# Patient Record
Sex: Female | Born: 1950 | Race: Black or African American | Hispanic: No | Marital: Married
Health system: Southern US, Community
[De-identification: ages and names within clinical notes are randomized; demographics above are authoritative.]

---

## 1999-11-28 ENCOUNTER — Other Ambulatory Visit: Admission: RE | Admit: 1999-11-28 | Discharge: 1999-11-28 | Payer: Self-pay | Admitting: Family Medicine

## 2000-05-28 ENCOUNTER — Other Ambulatory Visit: Admission: RE | Admit: 2000-05-28 | Discharge: 2000-05-28 | Payer: Self-pay | Admitting: Dermatology

## 2000-08-23 ENCOUNTER — Ambulatory Visit (HOSPITAL_COMMUNITY): Admission: RE | Admit: 2000-08-23 | Discharge: 2000-08-23 | Payer: Self-pay | Admitting: Family Medicine

## 2000-08-23 ENCOUNTER — Encounter: Payer: Self-pay | Admitting: Family Medicine

## 2000-10-18 ENCOUNTER — Ambulatory Visit (HOSPITAL_COMMUNITY): Admission: RE | Admit: 2000-10-18 | Discharge: 2000-10-18 | Payer: Self-pay | Admitting: Family Medicine

## 2000-10-18 ENCOUNTER — Encounter: Payer: Self-pay | Admitting: Family Medicine

## 2000-10-28 ENCOUNTER — Other Ambulatory Visit: Admission: RE | Admit: 2000-10-28 | Discharge: 2000-10-28 | Payer: Self-pay | Admitting: Obstetrics and Gynecology

## 2000-10-28 ENCOUNTER — Encounter (INDEPENDENT_AMBULATORY_CARE_PROVIDER_SITE_OTHER): Payer: Self-pay | Admitting: Specialist

## 2000-11-22 ENCOUNTER — Encounter: Payer: Self-pay | Admitting: Obstetrics and Gynecology

## 2000-11-24 ENCOUNTER — Encounter (INDEPENDENT_AMBULATORY_CARE_PROVIDER_SITE_OTHER): Payer: Self-pay | Admitting: Specialist

## 2000-11-24 ENCOUNTER — Encounter: Payer: Self-pay | Admitting: Obstetrics and Gynecology

## 2000-11-25 ENCOUNTER — Inpatient Hospital Stay (HOSPITAL_COMMUNITY): Admission: EM | Admit: 2000-11-25 | Discharge: 2000-11-26 | Payer: Self-pay | Admitting: Obstetrics and Gynecology

## 2002-02-08 ENCOUNTER — Other Ambulatory Visit: Admission: RE | Admit: 2002-02-08 | Discharge: 2002-02-08 | Payer: Self-pay | Admitting: Obstetrics and Gynecology

## 2003-04-25 ENCOUNTER — Other Ambulatory Visit: Admission: RE | Admit: 2003-04-25 | Discharge: 2003-04-25 | Payer: Self-pay | Admitting: Obstetrics and Gynecology

## 2006-06-03 ENCOUNTER — Encounter: Admission: RE | Admit: 2006-06-03 | Discharge: 2006-06-03 | Payer: Self-pay | Admitting: Obstetrics and Gynecology

## 2007-09-08 ENCOUNTER — Encounter: Admission: RE | Admit: 2007-09-08 | Discharge: 2007-09-08 | Payer: Self-pay | Admitting: Obstetrics and Gynecology

## 2008-10-05 ENCOUNTER — Encounter: Admission: RE | Admit: 2008-10-05 | Discharge: 2008-10-05 | Payer: Self-pay | Admitting: Obstetrics and Gynecology

## 2009-10-14 ENCOUNTER — Encounter: Admission: RE | Admit: 2009-10-14 | Discharge: 2009-10-14 | Payer: Self-pay | Admitting: Obstetrics and Gynecology

## 2010-09-19 IMAGING — MG MM SCREEN MAMMOGRAM BILATERAL
5 series · 5 of 5 positions shown · non-contrast
Comparison: none

DG SCREEN MAMMOGRAM BILATERAL
Bilateral CC and MLO view(s) were taken.

DIGITAL SCREENING MAMMOGRAM WITH CAD:
There are scattered fibroglandular densities.  No masses or malignant type calcifications are 
identified.  Compared with prior studies.
Images were processed with CAD.

[R CC]
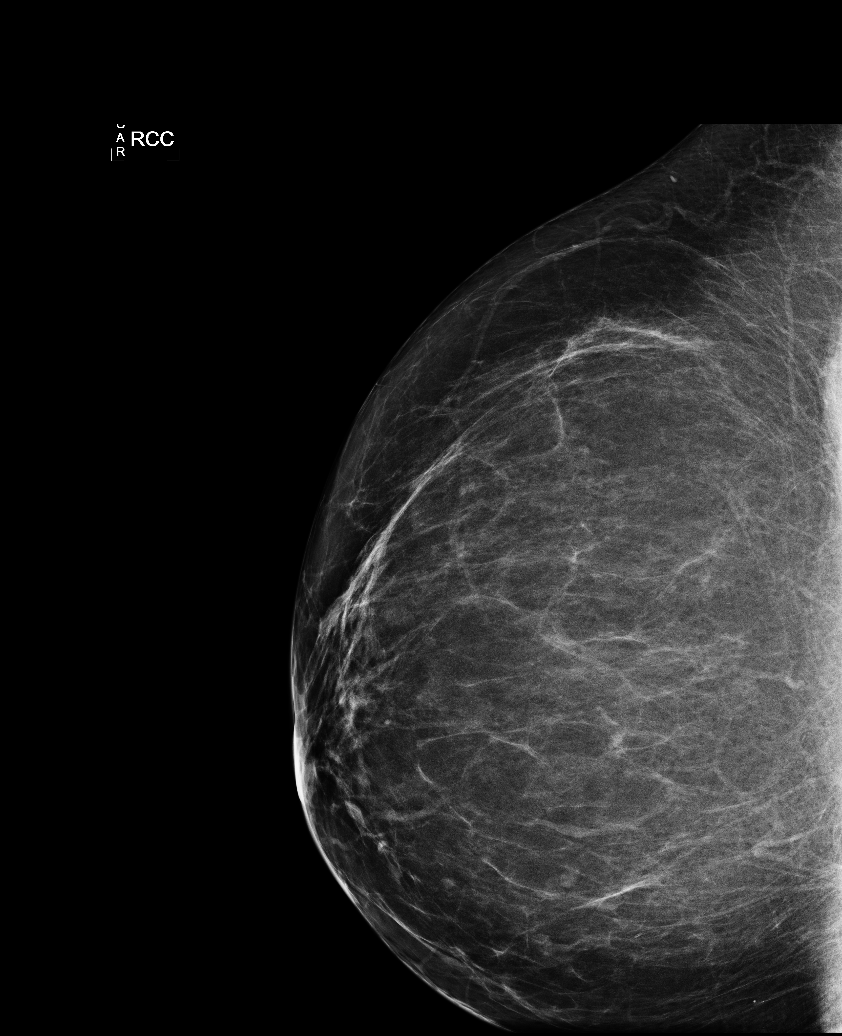

[L CC]
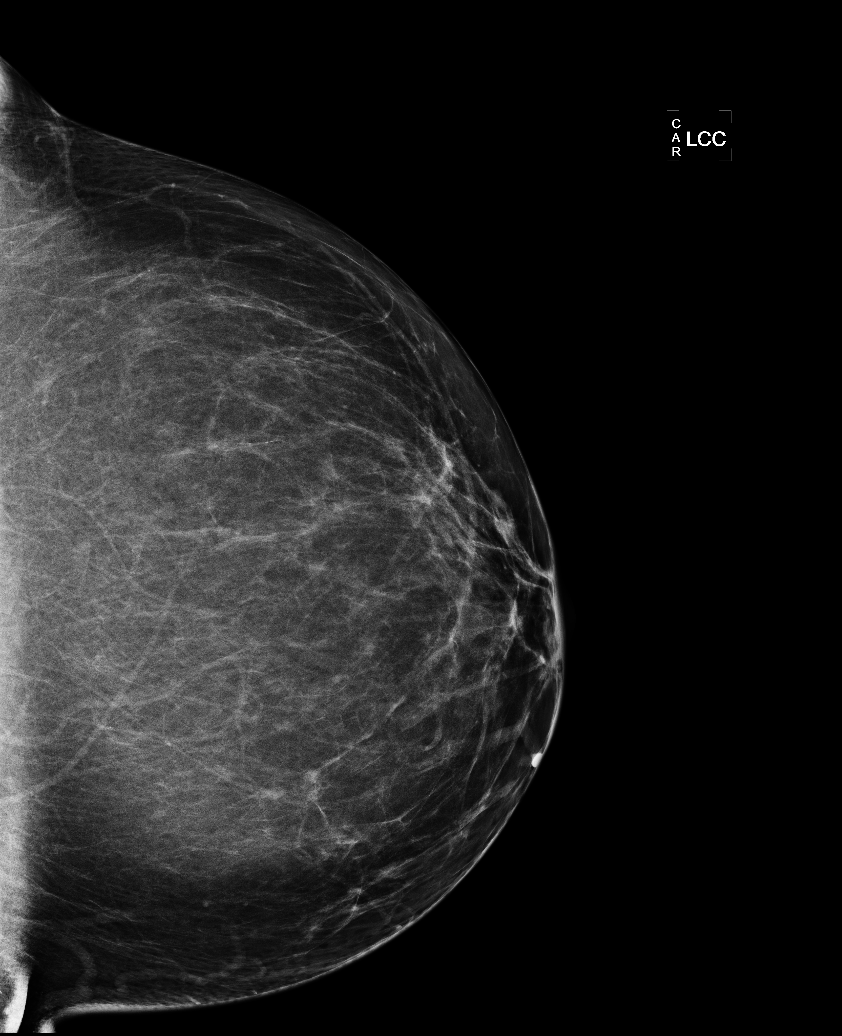

[L MLO]
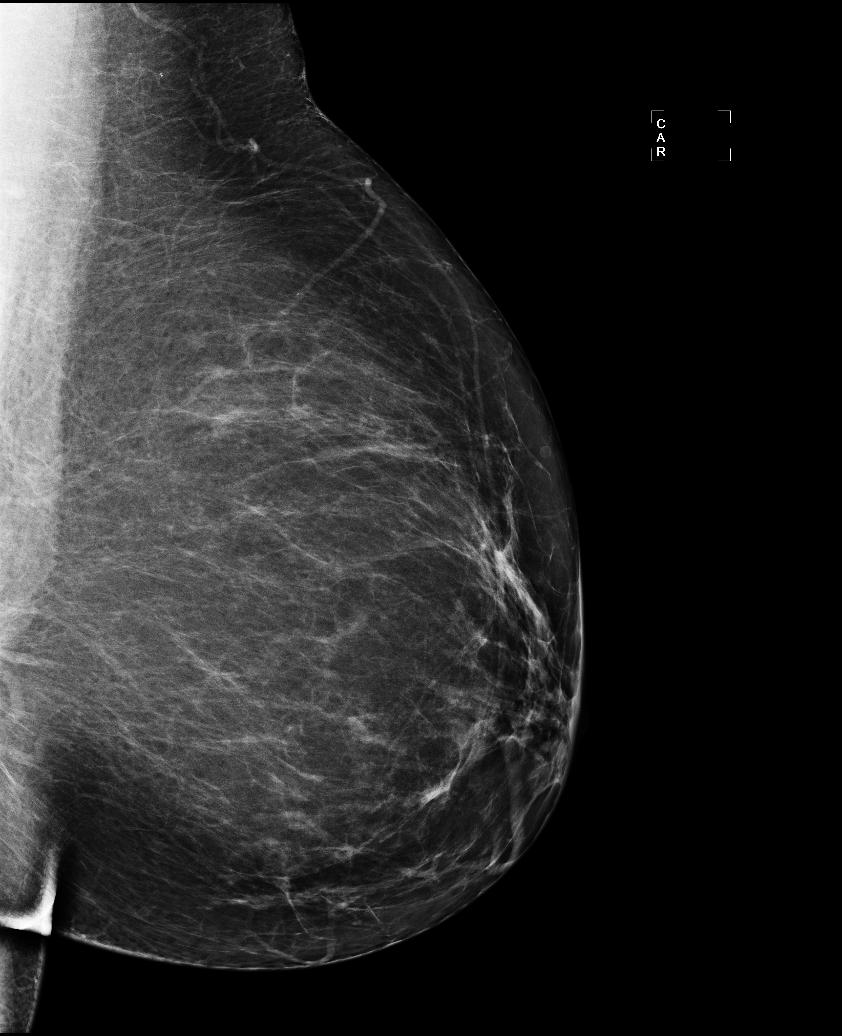

[R MLO]
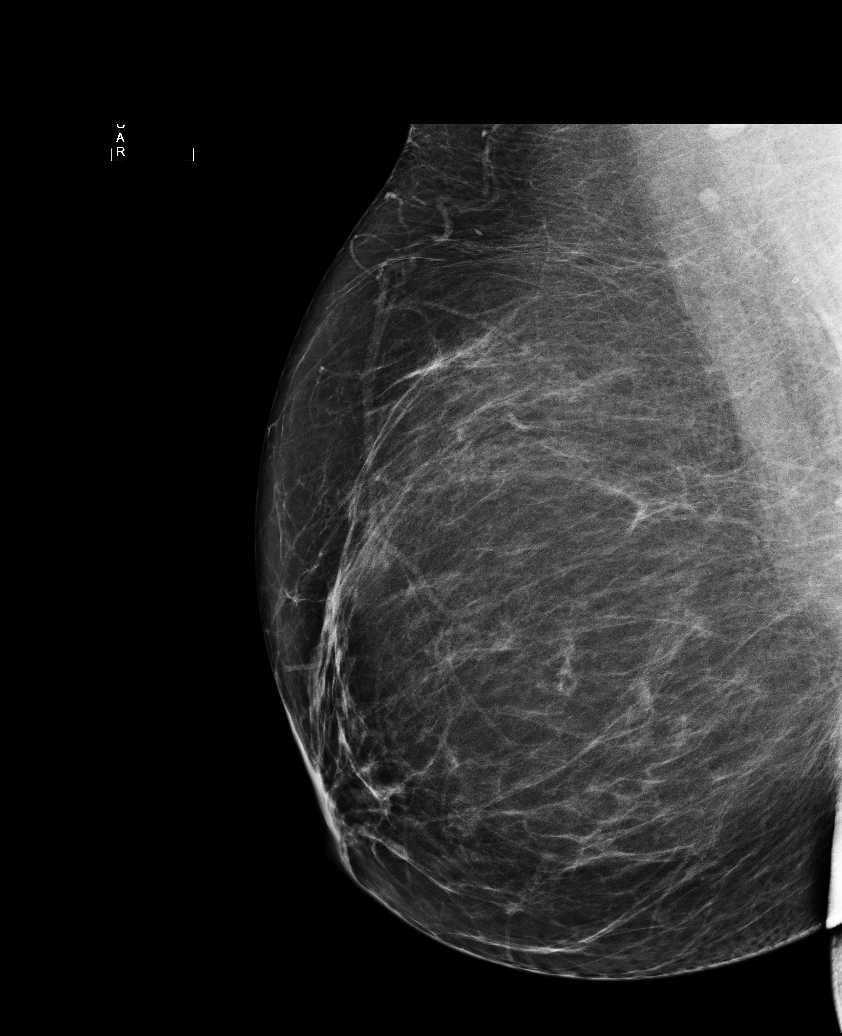

[L XCCL]
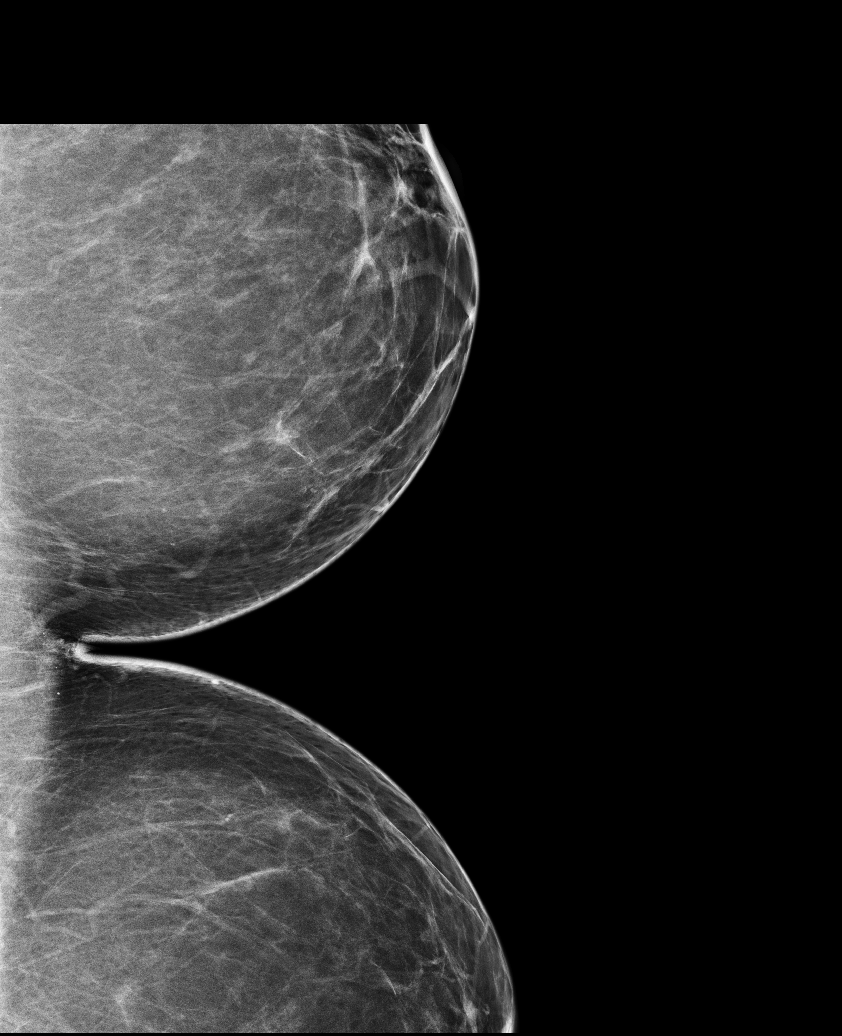

[5 of 5 positions shown; findings below may reference images not displayed]

IMPRESSION: No specific mammographic evidence of malignancy.  Next screening mammogram is recommended in one 
year.

A result letter of this screening mammogram will be mailed directly to the patient.

ASSESSMENT: Negative - BI-RADS 1

Screening mammogram in 1 year.
,

## 2010-12-01 ENCOUNTER — Encounter
Admission: RE | Admit: 2010-12-01 | Discharge: 2010-12-01 | Payer: Self-pay | Source: Home / Self Care | Attending: Obstetrics and Gynecology | Admitting: Obstetrics and Gynecology

## 2011-01-03 ENCOUNTER — Encounter: Payer: Self-pay | Admitting: Obstetrics and Gynecology

## 2011-01-04 ENCOUNTER — Encounter: Payer: Self-pay | Admitting: Obstetrics and Gynecology

## 2011-05-01 NOTE — Discharge Summary (Signed)
[facility]  Patient:    Chloe Reed, Chloe Reed               MRN: [id number] Adm. Date:  [id number] Disc. Date: [id number] Attending:  Oliver Pila                           Discharge Summary  DISCHARGE DIAGNOSES 1. Complex left adnexal mass. 2. Pelvic pain. 3. Chronic pelvic inflammatory disease. 4. Fibroid uterus. 5. Status post diagnostic laparoscopy. 6. Status post total abdominal hysterectomy, bilateral salpingo-oophorectomy    and lysis of adhesions.  DISCHARGE MEDICATIONS 1. Motrin 600 mg p.o. every six hours p.r.n. pain. 2. Percocet one to two tablets p.o. every four hours p.r.n. 3. Hydrochlorothiazide. 4. Clonidine. 5. Atenolol. 6. Flonase p.r.n.  DISCHARGE FOLLOWUP:  Patient is to follow up in the office in approximately three days for staple removal.  HOSPITAL COURSE:  Patient is a 60 year old G1, P0 who was admitted for scheduled diagnostic laparoscopy, given a complex adnexal mass which was identified, secondary to irregular menstrual bleeding and pain the patient had been experiencing.  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  The amount of adhesions and scar tissue made visualization of the ovary in question impossible; therefore, the decision was made to proceed with a total abdominal hysterectomy and bilateral salpingo-oophorectomy, for which the patient had been counseled prior to the procedure.  At the time of surgery, GYN oncologist, Dr. Rande Brunt. Clarke-Pearson, was called into the room and also visualized the liver, diaphragm and omentum and found no evidence of any ovarian cancer.  The patient was admitted after surgery for routine postoperative care and did well.  On postoperative day #2, she was tolerating a regular diet, passing good flatus,  blood pressure was normal, she was afebrile, she had good urine output.  Postoperative hemoglobin was 10. She did have some hypokalemia which was corrected in her IV fluids prior to discharge with a potassium of 3.4.  Overall, the patient did very well postoperatively and was discharged to home on postoperative day #2, to follow up as previously stated. DD:  12/09/00 TD:  12/10/00 Job: [id number]

## 2011-05-01 NOTE — Op Note (Signed)
[facility]  Patient:    Chloe Reed, Chloe Reed               MRN: [id number] Proc. Date: 11/24/00 Adm. Date:  [id number] Attending:  Oliver Pila                           Operative Report  PREOPERATIVE DIAGNOSES: 1. Complex left adnexal mass. 2. Pelvic pain.  POSTOPERATIVE DIAGNOSES: 1. Chronic pelvic inflammatory disease. 2. Fibroid uterus. 3. Bilateral tubo-ovarian abscesses.  PROCEDURES: 1. Diagnostic laparoscopy. 2. Total abdominal hysterectomy, bilateral salpingo-oophorectomy    and lysis of adhesions.  SURGEON:  Alvino Chapel, M.D.  ASSISTANT:  Malachi Pro. Ambrose Mantle, M.D.  ANESTHESIA:  General.  FLUIDS, ESTIMATED BLOOD LOSS:  900 cc.  Urine output 50 cc.  IV fluids 4800 cc with 1 unit of Hespan given.  FINDINGS:  There was chronic pelvic inflammatory disease with bilateral old tubo-ovarian abscesses and extensive scarring of both the right and the left adnexa into the cul-de-sac and into the pelvic sidewall.  There was also scarring of the rectum and bowel to the posterior surface of the uterus and the adnexa.  Liver, diaphragm, and omentum were all examined and found to be normal.  Intraoperative consultation at laparoscopy was confirmed with Dr. Stanford Breed, GYN oncologist, who confirmed that there indeed did not look to be any cancer present.  DESCRIPTION OF PROCEDURE:  The patient was taken to the operating room where general anesthesia was obtained without difficulty.  She was then prepped and draped in the normal sterile fashion in the dorsal lithotomy position.  A speculum was then placed in the vagina.  The cervix was identified, grasped with a single-tooth tenaculum and sounded with the internalized sequentially dilated.  The ZUMI syringe was then placed within the uterus for uterine manipulation, and the speculum and tenaculum were removed from the vagina. Attention was then turned to the patients abdomen  where an umbilical incision was made, and the Veress needle was introduced into the abdomen. Intraperitoneal placement was confirmed by both aspiration and injection with normal saline.  The gas was then applied, and a pneumoperitoneum was obtained with approximately 2.5 to 3 liters of CO2 gas.  The Veress needle was then removed, and the trocar was introduced into the abdomen, and the camera was then introduced into the pelvis with the findings as previously stated. Intraoperative consultation again was performed with Dr. Stanford Breed who visualized the pelvis anatomy and felt that this was not carcinogenic in nature but old PID.  The decision was also made to proceed with open abdominal hysterectomy, bilateral salpingo-oophorectomy as removal and confirmation of the left ovarian mass was impossible through the laparoscopic approach.  The abdomen was therefore then opened in a transverse fashion with a scalpel. This was carried down to the underlying layer of fascia by sharp dissection and Bovie cautery.  The fascia was then nicked in the midline and then the incision was extended laterally with Mayo scissors.  The inferior aspect of the incision was grasped with straight Kocher clamps, elevated and dissected off the underlying rectus muscles.  In a similar fashion, the superior aspect was dissected off the underlying rectus muscles.  The rectus muscles were then separated in the midline, and the peritoneum was identified, grasped with a hemostat x 2 and entered sharply with Metzenbaum scissors.  This incision was then extended both superiorly and inferiorly with careful attention to avoid both bowel and  bladder.  The pediatric bowel retractor was then placed within the abdomen, and the bowel was packed away with moist laparotomy sponges.  The adnexa and uterus were then palpated, and two long curved Kelly clamps were placed on each cornua of the uterus.  It was impossible to bluntly  elevate the adnexa from the cul-de-sac as they were densely adherent; therefore, the round ligament was separated on the patients left, then right, with an attempt made to open the retroperitoneal space.  This was successful, and the infundibular pelvic ligament on the patients left was isolated, doubly clamped with slightly curved Zeppelin clamps and transected and suture ligated with #0 Vicryl.  In a similar fashion, the retroperitoneal space was partially opened, and the infundibular pelvic ligament isolated, doubly clamped with Zeppelin clamps and transected with Mayo scissors.  Again, these pedicles were secured with #0 Vicryl sutures.  The bladder flap was then developed interiorly with dense scarring noted on the anterior surface of the uterus as well; however, there was a plane eventually established which was able to be pushed away from the lower uterine segment and cervix.  The uterus was then removed by sequentially clamping the utero-ovarian ligaments bilaterally and cutting with each step secured with a #0 Vicryl.  This was continued down to the level of the cervix.  Again, there was some difficulty in dissecting away the bladder flap and the posterior cervix had adherent tissue of the adnexa and the rectum.  This was bluntly dissected satisfactorily, and the remainder of the cervix was then clamped with straight Zeppelin clamps, transsected, and suture ligated bilaterally through the cardinal ligament to the level of the external cervical os.  The vagina was then entered, and the cervix amputated with Mayo scissors.  The uterus was then handed off to pathology.  The vaginal cuff was then closed at each angle with several figure-of-eight #0 Vicryl sutures for good hemostasis.  The pelvis and abdomen were then irrigated and suction was applied.  At this point, the left adnexa was able to be bluntly elevated out of the left lower quadrant and cul-de-sac by lysing the adhesions  digitally. With this removed, it was handed off to pathology for frozen section.  The last attention was turned to the patients right adnexa which was  significantly more adherent than the left.  A large simple-appearing cyst on the right adnexa was punctured and drained to allow increased mobility.  There was a small portion of infundibular pelvic ligament which was identified at this time untransected, and this was doubly clamped with Zeppelin, transected, and suture ligated.  The remainder of the adnexa was carefully removed by sharp and blunt dissection through dense adhesions into the cul-de-sac.  With these thus lysed, the adnexa was free and was removed from the pelvis and handed off with the uterine specimen.  Once again, the pelvis was irrigated. There were several areas of bleeding noted, one in the peritoneal surface posterior cervix which was cauterized with Bovie cautery and also suture ligated.  There were several areas of oozing along the retroperitoneal surfaces bilaterally; however, no distinct bleeders were identified.  The ureters were identified and found to be intact bilaterally, and the rectum was examined and found to be intact.  Surgicel was placed within the cul-de-sac for improved hemostasis of the slight peritoneal oozing, and all instruments and sponges were removed from the abdomen.  The 12-mm trocar site at the umbilicus was repaired under direct visualization at the fascia before the incision  was closed.  The muscle and sub fascial plane were then inspected and found to be hemostatic; therefore, the fascia was closed with #0 Vicryl in a running fashion, and the skin was closed with staples.  Sponge, lap, and needle counts were correct for the laparotomy x 2.  The laparoscopic portion of the procedure had one missing sponge; therefore, the patient underwent a stat x-ray which revealed no foreign objects in the abdomen. DD:  11/24/00 TD:  11/24/00 Job:  [id number]

## 2011-05-01 NOTE — H&P (Signed)
[facility]  Patient:    Chloe Reed, Chloe Reed                    MRN: [id number] Adm. Date:  11/24/00 Attending:  Alvino Chapel, M.D.                         History and Physical  HISTORY OF PRESENT ILLNESS:  The patient is a 60 year old G 1, P 0, 0, 1, 0, who was seen as a referral for a complex adnexal mass on October 28, 2000.  At that appointment, the patient stated that she had had irregular menses approximately  every two weeks to 21 days, with some intermenstrual bleeding, and had occasional hot flashes.  As a result of these problems, she was being seen by her family medicine doctor who ordered an ultrasound, which demonstrated both a fibroid uterus, as well as a left adnexal mass on the left, which was containing thickened septations with possible ovarian cancer noted.  PAST MEDICAL HISTORY:  Otherwise the patient is in good health.  She has chronic hypertension which was controlled with multiple medications.  She had a history of abnormal Pap smears; however, her two recent Pap smears had been normal.  PAST SURGICAL HISTORY:  Only a rectal fistula repair at 73-24 years old.  CURRENT MEDICATIONS: 1. Hydrochlorothiazide. 2. Clonidine. 3. Atenolol. 4. Flonase. 5. Claritin.  SOCIAL HISTORY:  She is a one-pack-per-day-smoker.  FAMILY HISTORY:  Significant for her mother with both renal cancer and a cancer of the reproductive organs, of unsure etiology.  There is no breast cancer in the family.  The patient has had one elective abortion in the past, and otherwise had no children.  PHYSICAL EXAMINATION:  GENERAL:  The patient appeared of normal height, and slightly overweight.  HEENT/NECK:  Within normal limits.  Thyroid within normal limits.  HEART:  Regular rate and rhythm.  LUNGS:  Clear to auscultation bilaterally.  ABDOMEN:  Soft, nontender.  EXTREMITIES:  Without edema.  The patient had normal external genitalia  with some sebaceous cysts noted.  There was no adenopathy noted in the groin area.  PELVIC:  Cervix had some nabothian cysts, otherwise no lesions.  Uterus was in he upper limits of normal size.  The right adnexa was upper limit of normal.  The eft adnexa did not palpate enlarged.  A long discussion with the patient was had, discussing the complex adnexal mass  noted on ultrasound.  The patient was counseled that this could possibly be a benign process; however, given the septations, ovarian cancer could not be excluded.  Also given the patients history of irregular menstrual bleeding and intermenstrual bleeding, she had an endometrial biopsy which was negative. Further workup included an [facility] and a CA-125 level.  The CA-125 level was normal at 14, nd the [facility] demonstrated that she was still premenopausal.  In three sessions, the patient was extensively counseled regarding the need to proceed with surgical removal of the complex left adnexal mass.  The decision was made to proceed with a laparoscopic removal of the ovary with frozen pathology obtained, and should there be any cancer noted, proceeding with a total abdominal hysterectomy, bilateral salpingo-oophorectomy, and staging procedure at the same time.  Dr. Rande Brunt. Clarke-Pearson was notified, and will be on standby for the procedure.  There were no other physical examination signs which were suspicious for an ovarian malignancy; however, the patient was counseled that this  was still a possibility, and is in favor of proceeding with full surgery, should that be the case.  The risks and benefits of both the laparoscopic and possible open procedure were discussed with the patient, including possible bleeding, infection, and damage o the bowel and bladder.  The patient understands these possibilities, and desires to proceed with surgery. DD:  11/23/00 TD:  11/23/00 Job: [id number]

## 2011-11-11 ENCOUNTER — Other Ambulatory Visit: Payer: Self-pay | Admitting: Obstetrics and Gynecology

## 2011-11-11 DIAGNOSIS — Z1231 Encounter for screening mammogram for malignant neoplasm of breast: Secondary | ICD-10-CM

## 2011-12-23 ENCOUNTER — Ambulatory Visit
Admission: RE | Admit: 2011-12-23 | Discharge: 2011-12-23 | Disposition: A | Discharge status: Home / Self Care | Payer: 59 | Source: Ambulatory Visit | Attending: Obstetrics and Gynecology | Admitting: Obstetrics and Gynecology

## 2011-12-23 ENCOUNTER — Ambulatory Visit: Payer: Self-pay

## 2011-12-23 DIAGNOSIS — Z1231 Encounter for screening mammogram for malignant neoplasm of breast: Secondary | ICD-10-CM

## 2012-11-28 IMAGING — MG MM DIGITAL SCREENING BILAT
2 series · 2 of 2 positions shown · non-contrast
Comparison: none

DG SCREEN MAMMOGRAM BILATERAL
Bilateral CC and MLO view(s) were taken.

DIGITAL SCREENING MAMMOGRAM WITH CAD:
There are scattered fibroglandular densities.  No masses or malignant type calcifications are 
identified.  Compared with prior studies.
Images were processed with CAD.

[R CC]
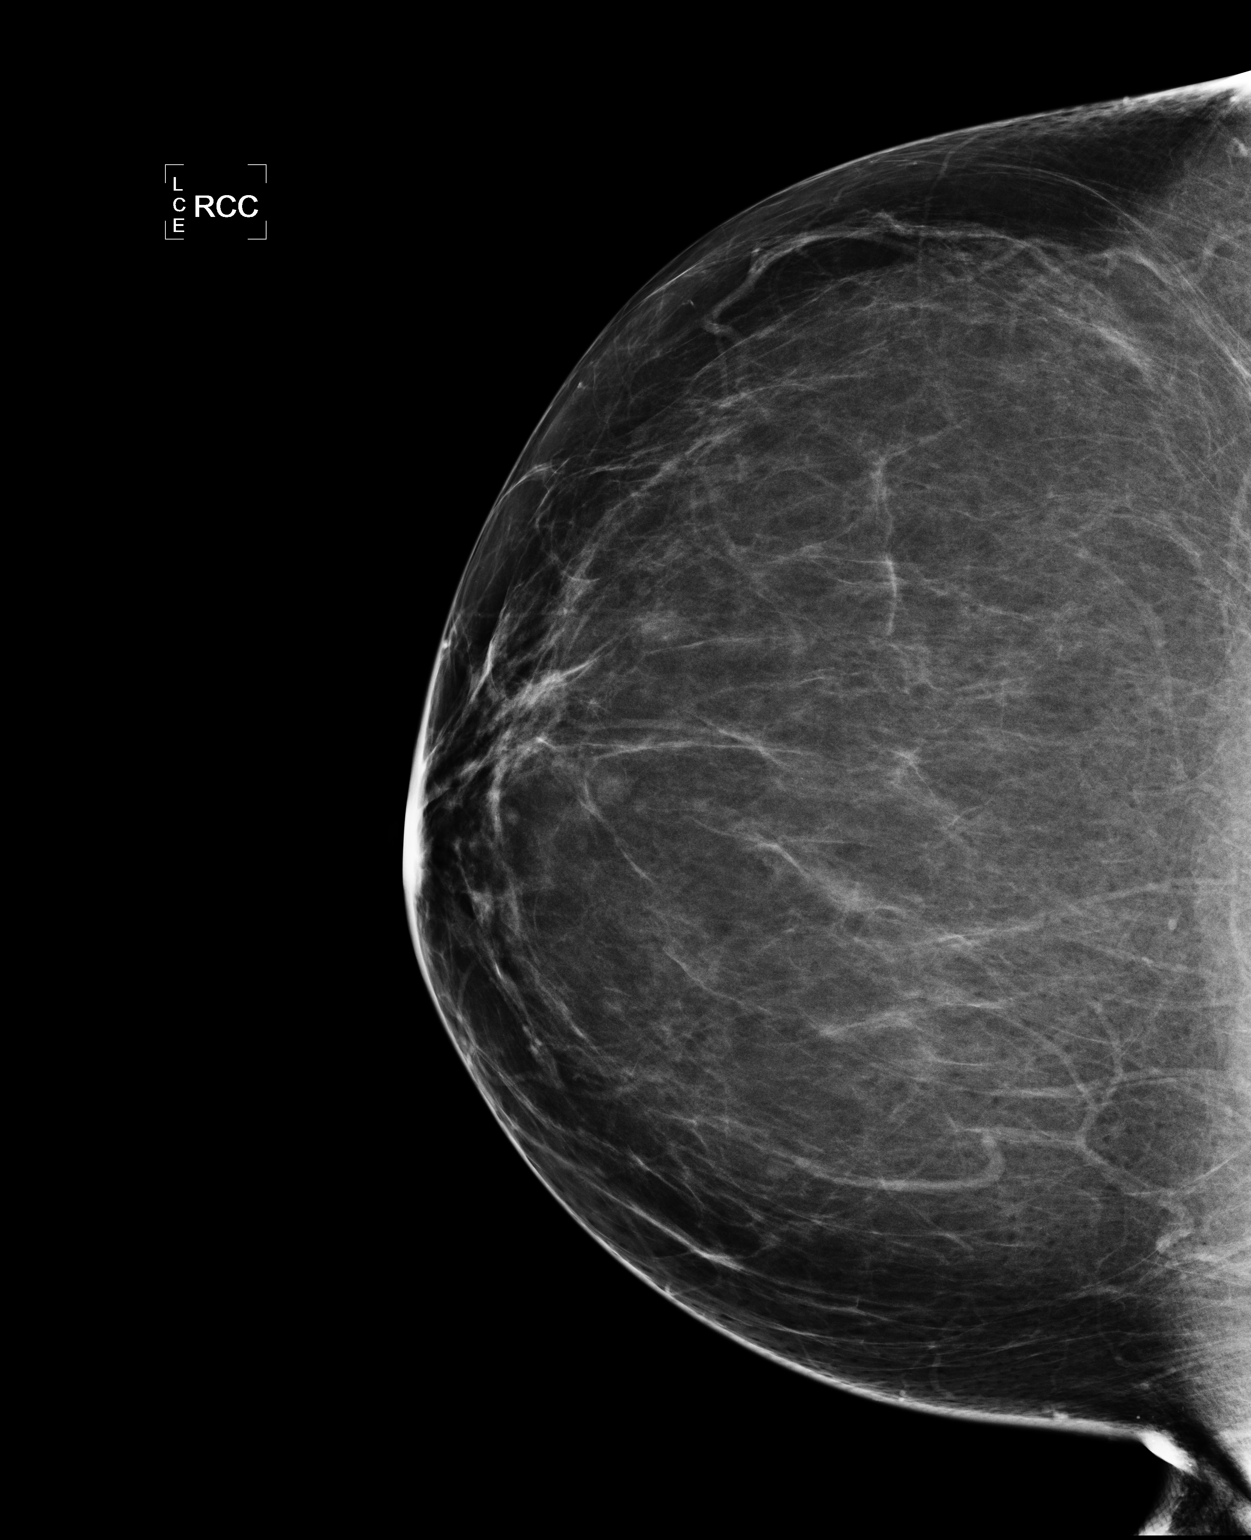

[L CC]
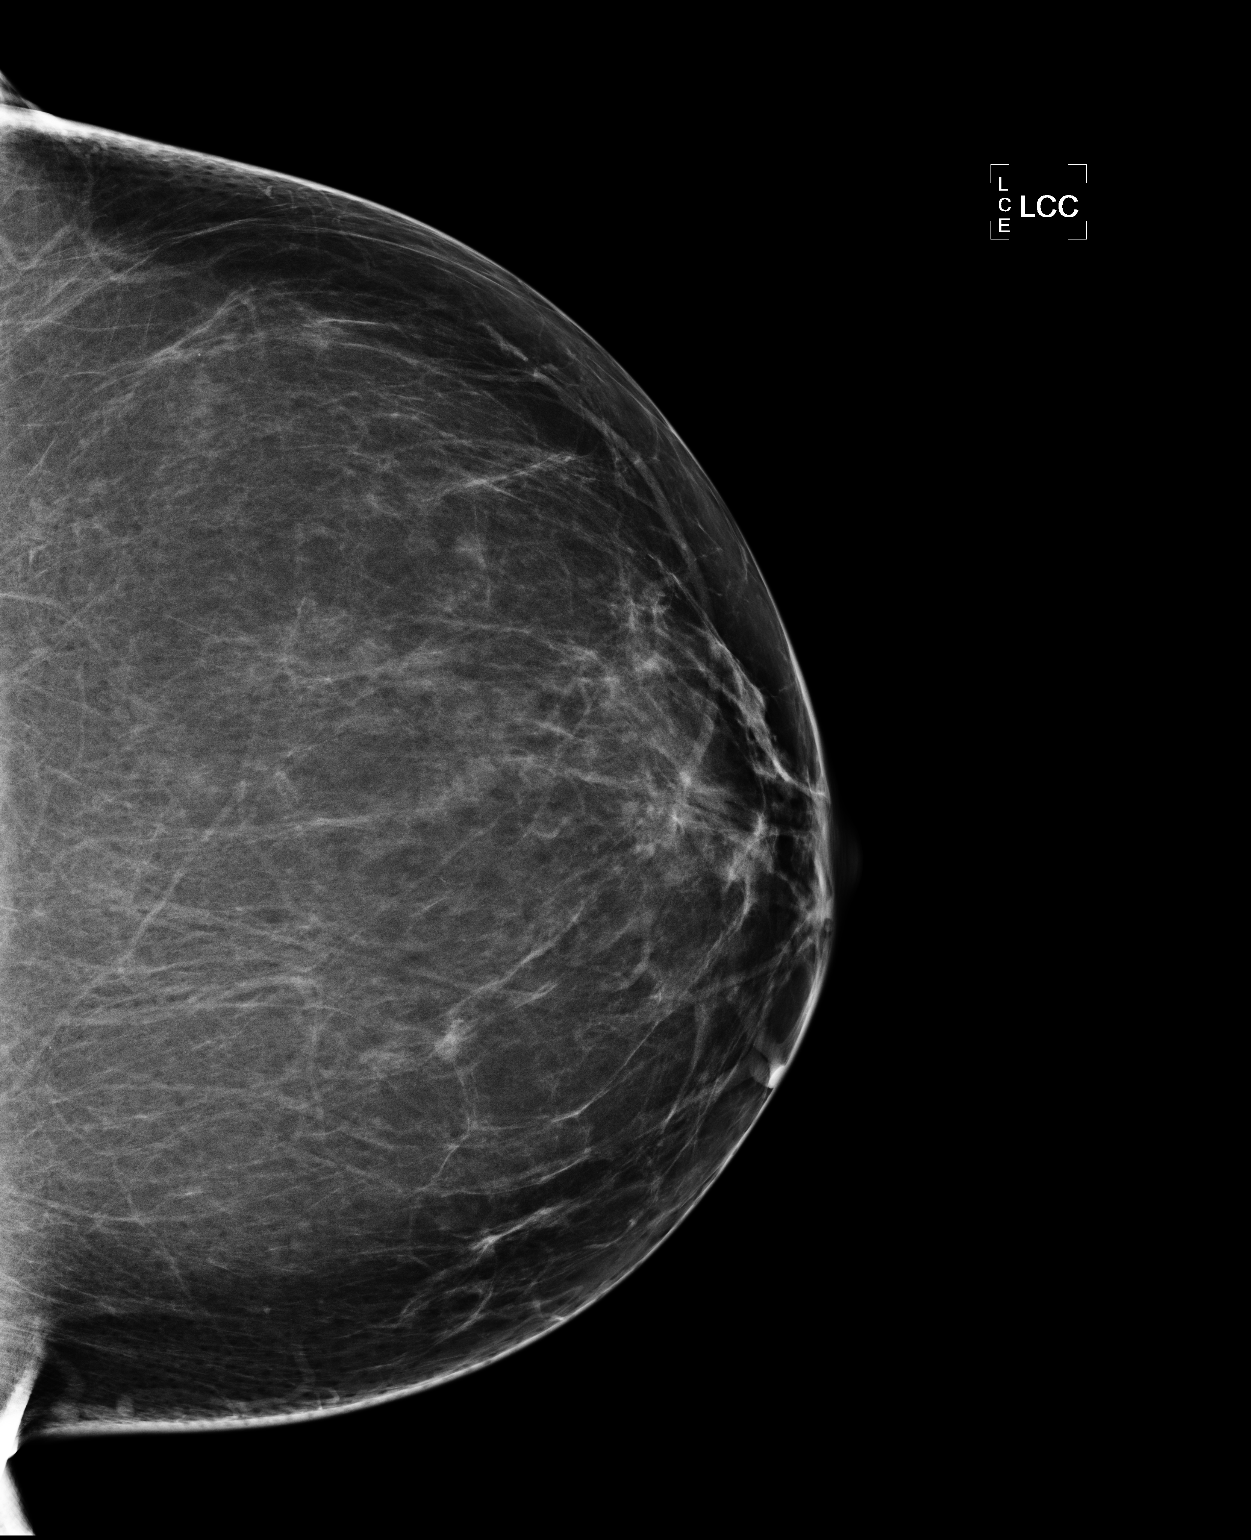

[2 of 2 positions shown; findings below may reference images not displayed]

IMPRESSION: No specific mammographic evidence of malignancy.  Next screening mammogram is recommended in one 
year.

A result letter of this screening mammogram will be mailed directly to the patient.

ASSESSMENT: Negative - BI-RADS 1

Screening mammogram in 1 year.
,

## 2013-04-13 ENCOUNTER — Telehealth: Payer: Self-pay | Admitting: Oncology

## 2013-04-13 NOTE — Telephone Encounter (Signed)
S/w pt in re NP appt 05/23 @ 1:30 w/Dr. Clelia Croft Referring Dr. Laurann Montana Dx- Leukopenia Welcome packet mailed.

## 2013-04-18 ENCOUNTER — Telehealth: Payer: Self-pay | Admitting: Oncology

## 2013-04-18 NOTE — Telephone Encounter (Signed)
C/D 04/18/13 for appt. 05/05/13 °

## 2013-04-28 ENCOUNTER — Other Ambulatory Visit: Payer: Self-pay | Admitting: Oncology

## 2013-04-28 DIAGNOSIS — D72819 Decreased white blood cell count, unspecified: Secondary | ICD-10-CM

## 2013-05-05 ENCOUNTER — Other Ambulatory Visit (HOSPITAL_COMMUNITY)
Admission: RE | Admit: 2013-05-05 | Discharge: 2013-05-05 | Disposition: A | Discharge status: Home / Self Care | Payer: Medicare HMO | Source: Ambulatory Visit | Attending: Oncology | Admitting: Oncology

## 2013-05-05 ENCOUNTER — Encounter: Payer: Self-pay | Admitting: Oncology

## 2013-05-05 ENCOUNTER — Ambulatory Visit: Payer: Medicare HMO

## 2013-05-05 ENCOUNTER — Encounter: Payer: Self-pay | Admitting: *Deleted

## 2013-05-05 ENCOUNTER — Other Ambulatory Visit (HOSPITAL_BASED_OUTPATIENT_CLINIC_OR_DEPARTMENT_OTHER): Payer: Medicare HMO | Admitting: Lab

## 2013-05-05 ENCOUNTER — Telehealth: Payer: Self-pay | Admitting: Oncology

## 2013-05-05 ENCOUNTER — Ambulatory Visit (HOSPITAL_BASED_OUTPATIENT_CLINIC_OR_DEPARTMENT_OTHER): Payer: Medicare HMO | Admitting: Oncology

## 2013-05-05 VITALS — BP 136/83 | HR 72 | Temp 96.9°F | Resp 18 | Wt 207.6 lb

## 2013-05-05 DIAGNOSIS — D72820 Lymphocytosis (symptomatic): Secondary | ICD-10-CM

## 2013-05-05 DIAGNOSIS — D72819 Decreased white blood cell count, unspecified: Secondary | ICD-10-CM | POA: Insufficient documentation

## 2013-05-05 LAB — CHCC SMEAR

## 2013-05-05 LAB — CBC WITH DIFFERENTIAL/PLATELET
Basophils Absolute: 0 10*3/uL (ref 0.0–0.1)
HCT: 48.2 % — ABNORMAL HIGH (ref 34.8–46.6)
HGB: 16.3 g/dL — ABNORMAL HIGH (ref 11.6–15.9)
MCH: 28.8 pg (ref 25.1–34.0)
MCV: 85.1 fL (ref 79.5–101.0)
MONO#: 0.3 10*3/uL (ref 0.1–0.9)
MONO%: 9.8 % (ref 0.0–14.0)
NEUT%: 31.9 % — ABNORMAL LOW (ref 38.4–76.8)
Platelets: 185 10*3/uL (ref 145–400)
RBC: 5.66 10*6/uL — ABNORMAL HIGH (ref 3.70–5.45)
RDW: 13.3 % (ref 11.2–14.5)
lymph#: 1.7 10*3/uL (ref 0.9–3.3)

## 2013-05-05 LAB — COMPREHENSIVE METABOLIC PANEL (CC13)
Alkaline Phosphatase: 101 U/L (ref 40–150)
CO2: 28 mEq/L (ref 22–29)
Chloride: 104 mEq/L (ref 98–107)

## 2013-05-05 NOTE — Addendum Note (Signed)
Addended by: Reesa Chew on: 05/05/2013 03:01 PM   Modules accepted: Medications

## 2013-05-05 NOTE — Progress Notes (Signed)
Note dictated

## 2013-05-05 NOTE — Progress Notes (Signed)
Checked in new pt with no financial concerns. °

## 2013-05-05 NOTE — Progress Notes (Signed)
Mailed updated medication list to patient's home 

## 2013-05-05 NOTE — Telephone Encounter (Signed)
gv pt appt schedule for June.  °

## 2013-05-06 NOTE — Progress Notes (Signed)
CC:   Chloe Reed. Cliffton Asters, M.D.  REASON FOR CONSULTATION:  Leukocytopenia and lymphocytosis.  HISTORY OF PRESENT ILLNESS:  Chloe Reed is a pleasant 62 year old African American woman currently of [place]; Chloe Reed is native of [place].  Chloe Reed has a past medical history significant for hypertension, hyperlipidemia, and follows routinely with Dr. Cliffton Asters for her routine medical care.  On the most recent CBC noted on 04/04/2013 Chloe Reed had a total white cell count of 2.8 with a lymphocytosis of 53.6% and neutropenia with an absolute neutrophil count of 0.8.  Historically Chloe Reed has had fluctuating total white cell count as high as 3.6 in May of 2012 that was down to 3.0 back in July of 2013 and was up again at 3.5 in August of 2013 and then back again in February of 2014 down to 3.0. Her lymphocyte percentage was as high as 53.5% and 53.8% and been fluctuating around that range.  Her neutrophil percentages have also fluctuated between 28%-30%.  Chloe Reed is really asymptomatic from this.  Chloe Reed had not an opportunistic infection.  Had not had any fevers, had not any chills, had not had any lymphadenopathy.  Overall Chloe Reed is really minimally symptomatic.  Chloe Reed does have some chronic back pain issues. Chloe Reed also has had some occasional hematochezia and mucus in her stool but for the most part a very active healthy woman performing most activities of daily living without any hindrance or decline.  REVIEW OF SYSTEMS:  A comprehensive review of system was done and otherwise unremarkable.  PAST MEDICAL HISTORY:  Significant for hypertension, depression, allergies, hyperlipidemia.  Chloe Reed is status post hysterectomy done in 2001.  MEDICATIONS:  Reviewed.  Chloe Reed is currently on Benicar, lovastatin, aspirin, Flonase, Allegra.  SOCIAL HISTORY:  Chloe Reed is married.  Chloe Reed does not have any children.  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  Denied any alcohol or tobacco abuse.  ALLERGIES:   Lisinopril.  FAMILY HISTORY:  Chloe Reed had cancer although Chloe Reed is unclear what kind. Chloe Reed does know much about her father's family history.  Chloe Reed has 1 sister who has diabetes.  One brother had a stroke.  Really no other history of any malignant disorders or blood disorders.  PHYSICAL EXAMINATION:  General:  Alert, awake, pleasant woman, appeared in no active distress today.  Vital signs:  Her vitals showed a blood pressure of 136/83, pulse 72, respiration 18, temperature is 96.  Chloe Reed weighs 207 pounds.  ECOG performance status 0.  HEENT:  Head is normocephalic, atraumatic.  Pupils equal, round, reactive to light. Oral mucosa moist and pink.  Neck:  Supple without lymphadenopathy. Heart:  Regular rate and rhythm.  S1, S2.  Lungs:  Clear to auscultation without rhonchi or wheeze, dullness to percussion.  Abdomen:  Soft, nontender.  No hepatosplenomegaly.  Extremities:  Had no clubbing, cyanosis or edema.  Neurological:  Intact motor, sensory and deep tendon reflexes.  I could not appreciate any splenomegaly.  LABORATORY DATA:  Today showed a hemoglobin of 16, white cell count 3.2, platelet count 185.  Her lymphocyte percentage is 53.2%, neutrophil percentage 31.9.  Peripheral smear was personally reviewed today and showed enlarged lymphocytes atypical in appearance, slightly increased. Could not appreciate any blasts or any red cell fragmentation or platelet clumping.  ASSESSMENT AND PLAN:  A 62 year old woman with the following findings: 1. 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  Differential     diagnosis was  discussed today with Chloe Reed.  These include     reactive lymphocytosis versus viral infection versus possible     autoimmune disorder but definitely we need to rule out a     lymphoproliferative disorder.  These conditions that include low-     grade lymphomas such as chronic lymphocytic leukemia,  follicular     lymphoma manifesting itself with lymphocytosis, hairy cell     leukemia, among other a marginal zone lymphomas that can manifest     itself in this particular setting.  To work this up I will send her     blood for flow cytometry.  I will also obtain a CT scan abdomen and     pelvis to rule out lymphadenopathy splenomegaly to make sure that     we are not dealing with a lymphoproliferative disorder.  Overall I     feel that this is mild, chronic and definitely asymptomatic.  From     that reason probably Chloe Reed will not require any immediate     intervention but definitely confirming that with the specific     testing is necessary.  Chloe Reed might need a bone marrow biopsy     depending on these findings, and this also could be really rather     benign finding without any clinical significance;  I will ask her     to come back in a followup in 2 weeks after completion of these     studies, I will give my final diagnosis. 2. Colon cancer screening.  I urged her to go ahead with the     colonoscopy as a separate issue to make sure that also Chloe Reed is up to     speed on her age appropriate cancer screening.    ______________________________ Benjiman Core, M.D. FNS/MEDQ  D:  05/05/2013  T:  05/06/2013  Job:  [id number]

## 2013-05-09 ENCOUNTER — Telehealth: Payer: Self-pay | Admitting: Oncology

## 2013-05-09 NOTE — Telephone Encounter (Signed)
, °

## 2013-05-10 LAB — FLOW CYTOMETRY

## 2013-05-12 ENCOUNTER — Ambulatory Visit (HOSPITAL_COMMUNITY): Payer: Managed Care, Other (non HMO)

## 2013-05-19 ENCOUNTER — Ambulatory Visit (HOSPITAL_BASED_OUTPATIENT_CLINIC_OR_DEPARTMENT_OTHER): Payer: Medicare HMO | Admitting: Oncology

## 2013-05-19 ENCOUNTER — Telehealth: Payer: Self-pay | Admitting: Oncology

## 2013-05-19 VITALS — BP 123/82 | HR 84 | Temp 97.0°F | Resp 18 | Wt 209.3 lb

## 2013-05-19 DIAGNOSIS — D72820 Lymphocytosis (symptomatic): Secondary | ICD-10-CM

## 2013-05-19 DIAGNOSIS — D751 Secondary polycythemia: Secondary | ICD-10-CM

## 2013-05-19 NOTE — Telephone Encounter (Signed)
Gave pt appt for lab and MD on December 2014 °

## 2013-05-19 NOTE — Progress Notes (Signed)
Hematology and Oncology Follow Up Visit  Chloe Reed [id number] 1951-06-27 62 y.o. 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   Current therapy: Observation and surveillance.  Interim History: Chloe Reed is a pleasant 62 year old woman I am seeing for the second time since her initial evaluation on 05/06/2013. Since her last visit she has not reported any new symptoms to speak of. She has not reported any fevers is not reporting any chills sweats or any constitutional symptoms. She did not report any lymphadenopathy or any decline in her performance status. She refused to do the CT scan citing financial reasons.  Medications: I have reviewed the patient's current medications.  Current Outpatient Prescriptions  Medication Sig Dispense Refill  . aspirin 81 MG tablet Take 81 mg by mouth daily.      . Aspirin-Salicylamide-Caffeine (BC HEADACHE POWDER PO) Take 1 packet by mouth every 6 (six) hours as needed. For sinus  headaches      . BENICAR HCT 20-12.5 MG per tablet Take 1 tablet by mouth daily.      Marland Kitchen estradiol (ESTRACE) 0.5 MG tablet Take 1 tablet by mouth daily.      . fexofenadine (ALLEGRA) 180 MG tablet Take 180 mg by mouth daily. As needed      . fluticasone (FLONASE) 50 MCG/ACT nasal spray Place 2 sprays into the nose daily. As needed      . guaiFENesin (MUCINEX) 600 MG 12 hr tablet Take 1,200 mg by mouth 2 (two) times daily. As needed      . lovastatin (MEVACOR) 40 MG tablet Take 40 mg by mouth daily.      . naproxen sodium (ANAPROX) 220 MG tablet Take 220 mg by mouth 2 (two) times daily with a meal. As needed for discomfort       No current facility-administered medications for this visit.     Allergies: No Known Allergies  Past Medical History, Surgical history, Social history, and Family  History were reviewed and updated.  Review of Systems: A comprehensive review of systems was unremarkable  Physical Exam: Blood pressure 123/82, pulse 84, temperature 97 F (36.1 C), temperature source Oral, resp. rate 18, weight 209 lb 4.8 oz (94.938 kg). ECOG: 0 General appearance: alert Head: Normocephalic, without obvious abnormality, atraumatic Neck: no adenopathy, no carotid bruit, no JVD, supple, symmetrical, trachea midline and thyroid not enlarged, symmetric, no tenderness/mass/nodules Lymph nodes: Cervical, supraclavicular, and axillary nodes normal. Heart:regular rate and rhythm, S1, S2 normal, no murmur, click, rub or gallop Lung: Clear to auscultation Abdomin: soft, non-tender, without masses or organomegaly EXT:no erythema, induration, or nodules   Lab Results: Lab Results  Component Value Date   WBC 3.2* 05/05/2013   HGB 16.3* 05/05/2013   HCT 48.2* 05/05/2013   MCV 85.1 05/05/2013   PLT 185 05/05/2013     Chemistry      Component Value Date/Time   NA 140 05/05/2013 1341   K 4.1 05/05/2013 1341   CL 104 05/05/2013 1341   CO2 28 05/05/2013 1341   BUN 11.5 05/05/2013 1341   CREATININE 1.0 05/05/2013 1341      Component Value Date/Time   CALCIUM 9.8 05/05/2013 1341   ALKPHOS 101 05/05/2013 1341   AST 24 05/05/2013 1341   ALT 21 05/05/2013 1341   BILITOT 0.53 05/05/2013 1341         Impression  and Plan:  62 year old woman with the following issues:  1. Lymphocytosis: Differential diagnosis was discussed again with Chloe Reed which includes lymphoproliferative disorder which I think is less likely at this point given the fact that she had a normal flow cytometry testing. She does not have any symptoms to suggest lymphoma. Although she declined a CT scan probably less likely that she has a lymphoproliferative disorder. This is most likely reactive in nature and we will continue to monitor it moving forward. At this point I don't see a need to obtain a CT scan or a  bone marrow biopsy.  2. 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  She will followup in 6 months time and we will repeat her blood count and reevaluate her at that time.  Eli Hose, MD 6/6/20144:01 PM

## 2013-06-01 ENCOUNTER — Telehealth: Payer: Self-pay | Admitting: *Deleted

## 2013-06-01 NOTE — Telephone Encounter (Signed)
Lm informing the pt that we need her to come in early on 11/17/13. gv appt d/t for 11/17/13 labs @ 10am, and ov @ 10:30am. i  Also placed a letter/avs in the mail...td

## 2013-07-18 ENCOUNTER — Telehealth: Payer: Self-pay | Admitting: Oncology

## 2013-07-18 NOTE — Telephone Encounter (Signed)
Pt called to cx 12/5 lb/fu and does not wish to r/s. Message to FS.

## 2013-10-09 ENCOUNTER — Telehealth: Payer: Self-pay | Admitting: Oncology

## 2013-10-09 NOTE — Telephone Encounter (Signed)
Faxed pt medical records to [facility] °

## 2013-10-19 ENCOUNTER — Other Ambulatory Visit: Payer: Self-pay

## 2013-11-17 ENCOUNTER — Other Ambulatory Visit: Payer: Medicare HMO | Admitting: Lab

## 2013-11-17 ENCOUNTER — Ambulatory Visit: Payer: Medicare HMO | Admitting: Oncology

## 2016-04-29 ENCOUNTER — Encounter: Payer: Self-pay | Admitting: *Deleted

## 2019-03-07 ENCOUNTER — Other Ambulatory Visit: Payer: Self-pay | Admitting: Family Medicine

## 2019-03-07 DIAGNOSIS — E2839 Other primary ovarian failure: Secondary | ICD-10-CM

## 2019-03-07 DIAGNOSIS — Z1231 Encounter for screening mammogram for malignant neoplasm of breast: Secondary | ICD-10-CM

## 2019-06-22 ENCOUNTER — Ambulatory Visit
Admission: RE | Admit: 2019-06-22 | Discharge: 2019-06-22 | Disposition: A | Discharge status: Home / Self Care | Payer: Medicare HMO | Source: Ambulatory Visit | Attending: Family Medicine | Admitting: Family Medicine

## 2019-06-22 ENCOUNTER — Other Ambulatory Visit: Payer: Self-pay

## 2019-06-22 DIAGNOSIS — Z1231 Encounter for screening mammogram for malignant neoplasm of breast: Secondary | ICD-10-CM

## 2019-06-22 DIAGNOSIS — E2839 Other primary ovarian failure: Secondary | ICD-10-CM

## 2020-02-09 ENCOUNTER — Ambulatory Visit: Payer: Medicare Other | Attending: Internal Medicine

## 2020-02-09 DIAGNOSIS — Z23 Encounter for immunization: Secondary | ICD-10-CM | POA: Insufficient documentation

## 2020-02-09 NOTE — Progress Notes (Signed)
   Covid-19 Vaccination Clinic  Name:  Chloe Reed    MRN: [id number] DOB: 04-Feb-1951  02/09/2020  [name] was observed post Covid-19 immunization for 15 minutes without incidence. She was provided with Vaccine Information Sheet and instruction to access the V-Safe system.   [name] was instructed to call 911 with any severe reactions post vaccine: Marland Kitchen Difficulty breathing  . Swelling of your face and throat  . A fast heartbeat  . A bad rash all over your body  . Dizziness and weakness    Immunizations Administered    Name Date Dose VIS Date Route   Pfizer COVID-19 Vaccine 02/09/2020 11:39 AM 0.3 mL 11/24/2019 Intramuscular   Manufacturer: [employer], [employer]   Lot: [id number]   NDC: [address]-1

## 2020-03-05 ENCOUNTER — Ambulatory Visit: Payer: Medicare Other | Attending: Internal Medicine

## 2020-03-05 DIAGNOSIS — Z23 Encounter for immunization: Secondary | ICD-10-CM

## 2020-03-05 NOTE — Progress Notes (Signed)
   Covid-19 Vaccination Clinic  Name:  Chloe Reed    MRN: [id number] DOB: 16-Jan-1951  03/05/2020  [name] was observed post Covid-19 immunization for 15 minutes without incident. She was provided with Vaccine Information Sheet and instruction to access the V-Safe system.   [name] was instructed to call 911 with any severe reactions post vaccine: Marland Kitchen Difficulty breathing  . Swelling of face and throat  . A fast heartbeat  . A bad rash all over body  . Dizziness and weakness   Immunizations Administered    Name Date Dose VIS Date Route   Pfizer COVID-19 Vaccine 03/05/2020  2:24 PM 0.3 mL 11/24/2019 Intramuscular   Manufacturer: [employer], [employer]   Lot: [id number]   NDC: [address]-2

## 2020-09-23 ENCOUNTER — Ambulatory Visit: Payer: Medicare Other | Attending: Internal Medicine

## 2020-09-23 DIAGNOSIS — Z23 Encounter for immunization: Secondary | ICD-10-CM

## 2020-09-23 NOTE — Progress Notes (Signed)
° °  Covid-19 Vaccination Clinic  Name:  Chloe Reed    MRN: [id number] DOB: January 12, 1951  09/23/2020  [name] was observed post Covid-19 immunization for 15 minutes without incident. She was provided with Vaccine Information Sheet and instruction to access the V-Safe system.   [name] was instructed to call 911 with any severe reactions post vaccine:  Difficulty breathing   Swelling of face and throat   A fast heartbeat   A bad rash all over body   Dizziness and weakness

## 2021-02-27 DIAGNOSIS — H524 Presbyopia: Secondary | ICD-10-CM | POA: Diagnosis not present

## 2021-03-03 DIAGNOSIS — Z0000 Encounter for general adult medical examination without abnormal findings: Secondary | ICD-10-CM | POA: Diagnosis not present

## 2021-03-03 DIAGNOSIS — Z23 Encounter for immunization: Secondary | ICD-10-CM | POA: Diagnosis not present

## 2021-03-03 DIAGNOSIS — D709 Neutropenia, unspecified: Secondary | ICD-10-CM | POA: Diagnosis not present

## 2021-03-03 DIAGNOSIS — D72820 Lymphocytosis (symptomatic): Secondary | ICD-10-CM | POA: Diagnosis not present

## 2021-03-03 DIAGNOSIS — I10 Essential (primary) hypertension: Secondary | ICD-10-CM | POA: Diagnosis not present

## 2021-03-03 DIAGNOSIS — E785 Hyperlipidemia, unspecified: Secondary | ICD-10-CM | POA: Diagnosis not present

## 2021-03-28 DIAGNOSIS — H43811 Vitreous degeneration, right eye: Secondary | ICD-10-CM | POA: Diagnosis not present

## 2021-04-25 DIAGNOSIS — H43811 Vitreous degeneration, right eye: Secondary | ICD-10-CM | POA: Diagnosis not present

## 2021-05-15 ENCOUNTER — Other Ambulatory Visit (HOSPITAL_BASED_OUTPATIENT_CLINIC_OR_DEPARTMENT_OTHER): Payer: Self-pay

## 2021-05-15 ENCOUNTER — Other Ambulatory Visit: Payer: Self-pay

## 2021-05-15 ENCOUNTER — Ambulatory Visit: Payer: Medicare Other | Attending: Internal Medicine

## 2021-05-15 DIAGNOSIS — Z23 Encounter for immunization: Secondary | ICD-10-CM

## 2021-05-15 MED ORDER — PFIZER-BIONT COVID-19 VAC-TRIS 30 MCG/0.3ML IM SUSP
INTRAMUSCULAR | 0 refills | Status: AC
  Filled 2021-05-15: qty 0.3, 1d supply, fill #0

## 2021-05-15 NOTE — Progress Notes (Signed)
   Covid-19 Vaccination Clinic  Name:  Chloe Reed    MRN: [id number] DOB: 09-23-1951  05/15/2021  [name] was observed post Covid-19 immunization for 15 minutes without incident. She was provided with Vaccine Information Sheet and instruction to access the V-Safe system.   [name] was instructed to call 911 with any severe reactions post vaccine: Marland Kitchen Difficulty breathing  . Swelling of face and throat  . A fast heartbeat  . A bad rash all over body  . Dizziness and weakness   Immunizations Administered    Name Date Dose VIS Date Route   PFIZER Comrnaty(Gray TOP) Covid-19 Vaccine 05/15/2021  2:36 PM 0.3 mL 11/21/2020 Intramuscular   Manufacturer: [employer], [employer]   Lot: [id number]   NDC: [id number]

## 2021-11-24 DIAGNOSIS — L0211 Cutaneous abscess of neck: Secondary | ICD-10-CM | POA: Diagnosis not present

## 2021-12-01 DIAGNOSIS — L723 Sebaceous cyst: Secondary | ICD-10-CM | POA: Diagnosis not present

## 2021-12-01 DIAGNOSIS — L089 Local infection of the skin and subcutaneous tissue, unspecified: Secondary | ICD-10-CM | POA: Diagnosis not present

## 2022-01-22 DIAGNOSIS — J019 Acute sinusitis, unspecified: Secondary | ICD-10-CM | POA: Diagnosis not present

## 2022-01-22 DIAGNOSIS — R051 Acute cough: Secondary | ICD-10-CM | POA: Diagnosis not present

## 2022-03-09 DIAGNOSIS — I10 Essential (primary) hypertension: Secondary | ICD-10-CM | POA: Diagnosis not present

## 2022-03-09 DIAGNOSIS — D709 Neutropenia, unspecified: Secondary | ICD-10-CM | POA: Diagnosis not present

## 2022-03-09 DIAGNOSIS — Z0000 Encounter for general adult medical examination without abnormal findings: Secondary | ICD-10-CM | POA: Diagnosis not present

## 2022-03-09 DIAGNOSIS — M8588 Other specified disorders of bone density and structure, other site: Secondary | ICD-10-CM | POA: Diagnosis not present

## 2022-03-09 DIAGNOSIS — J309 Allergic rhinitis, unspecified: Secondary | ICD-10-CM | POA: Diagnosis not present

## 2022-03-09 DIAGNOSIS — M25511 Pain in right shoulder: Secondary | ICD-10-CM | POA: Diagnosis not present

## 2022-03-09 DIAGNOSIS — D72820 Lymphocytosis (symptomatic): Secondary | ICD-10-CM | POA: Diagnosis not present

## 2022-03-09 DIAGNOSIS — E785 Hyperlipidemia, unspecified: Secondary | ICD-10-CM | POA: Diagnosis not present

## 2022-03-09 DIAGNOSIS — Z23 Encounter for immunization: Secondary | ICD-10-CM | POA: Diagnosis not present

## 2022-03-09 DIAGNOSIS — K5900 Constipation, unspecified: Secondary | ICD-10-CM | POA: Diagnosis not present

## 2022-08-27 ENCOUNTER — Other Ambulatory Visit: Payer: Self-pay | Admitting: Family Medicine

## 2022-08-27 DIAGNOSIS — Z1231 Encounter for screening mammogram for malignant neoplasm of breast: Secondary | ICD-10-CM

## 2022-09-09 DIAGNOSIS — I10 Essential (primary) hypertension: Secondary | ICD-10-CM | POA: Diagnosis not present

## 2022-09-09 DIAGNOSIS — E785 Hyperlipidemia, unspecified: Secondary | ICD-10-CM | POA: Diagnosis not present

## 2022-09-09 DIAGNOSIS — D709 Neutropenia, unspecified: Secondary | ICD-10-CM | POA: Diagnosis not present

## 2022-09-09 DIAGNOSIS — J309 Allergic rhinitis, unspecified: Secondary | ICD-10-CM | POA: Diagnosis not present

## 2022-09-09 DIAGNOSIS — D72820 Lymphocytosis (symptomatic): Secondary | ICD-10-CM | POA: Diagnosis not present

## 2022-09-17 ENCOUNTER — Ambulatory Visit: Payer: Medicare Other

## 2022-09-18 ENCOUNTER — Ambulatory Visit: Payer: Medicare Other

## 2022-09-22 DIAGNOSIS — H43811 Vitreous degeneration, right eye: Secondary | ICD-10-CM | POA: Diagnosis not present

## 2022-10-08 ENCOUNTER — Ambulatory Visit: Payer: Medicare Other

## 2022-12-02 ENCOUNTER — Ambulatory Visit: Payer: Medicare Other

## 2023-01-26 ENCOUNTER — Ambulatory Visit
Admission: RE | Admit: 2023-01-26 | Discharge: 2023-01-26 | Disposition: A | Discharge status: Home / Self Care | Payer: Medicare Other | Source: Ambulatory Visit | Attending: Family Medicine | Admitting: Family Medicine

## 2023-01-26 DIAGNOSIS — Z1231 Encounter for screening mammogram for malignant neoplasm of breast: Secondary | ICD-10-CM

## 2023-03-26 DIAGNOSIS — G2581 Restless legs syndrome: Secondary | ICD-10-CM | POA: Diagnosis not present

## 2023-03-26 DIAGNOSIS — D72820 Lymphocytosis (symptomatic): Secondary | ICD-10-CM | POA: Diagnosis not present

## 2023-03-26 DIAGNOSIS — I10 Essential (primary) hypertension: Secondary | ICD-10-CM | POA: Diagnosis not present

## 2023-03-26 DIAGNOSIS — M8588 Other specified disorders of bone density and structure, other site: Secondary | ICD-10-CM | POA: Diagnosis not present

## 2023-03-26 DIAGNOSIS — K5900 Constipation, unspecified: Secondary | ICD-10-CM | POA: Diagnosis not present

## 2023-03-26 DIAGNOSIS — Z1211 Encounter for screening for malignant neoplasm of colon: Secondary | ICD-10-CM | POA: Diagnosis not present

## 2023-03-26 DIAGNOSIS — D709 Neutropenia, unspecified: Secondary | ICD-10-CM | POA: Diagnosis not present

## 2023-03-26 DIAGNOSIS — Z0000 Encounter for general adult medical examination without abnormal findings: Secondary | ICD-10-CM | POA: Diagnosis not present

## 2023-03-26 DIAGNOSIS — J309 Allergic rhinitis, unspecified: Secondary | ICD-10-CM | POA: Diagnosis not present

## 2023-03-26 DIAGNOSIS — E785 Hyperlipidemia, unspecified: Secondary | ICD-10-CM | POA: Diagnosis not present

## 2023-03-26 DIAGNOSIS — E119 Type 2 diabetes mellitus without complications: Secondary | ICD-10-CM | POA: Diagnosis not present

## 2023-03-29 ENCOUNTER — Other Ambulatory Visit: Payer: Self-pay | Admitting: Family Medicine

## 2023-03-29 DIAGNOSIS — M8580 Other specified disorders of bone density and structure, unspecified site: Secondary | ICD-10-CM

## 2023-03-30 ENCOUNTER — Ambulatory Visit
Admission: RE | Admit: 2023-03-30 | Discharge: 2023-03-30 | Disposition: A | Discharge status: Home / Self Care | Payer: Medicare Other | Source: Ambulatory Visit | Attending: Family Medicine | Admitting: Family Medicine

## 2023-03-30 DIAGNOSIS — M8589 Other specified disorders of bone density and structure, multiple sites: Secondary | ICD-10-CM | POA: Diagnosis not present

## 2023-03-30 DIAGNOSIS — M8580 Other specified disorders of bone density and structure, unspecified site: Secondary | ICD-10-CM

## 2023-03-30 DIAGNOSIS — Z780 Asymptomatic menopausal state: Secondary | ICD-10-CM | POA: Diagnosis not present

## 2023-09-28 DIAGNOSIS — H43811 Vitreous degeneration, right eye: Secondary | ICD-10-CM | POA: Diagnosis not present

## 2023-11-02 DIAGNOSIS — D72819 Decreased white blood cell count, unspecified: Secondary | ICD-10-CM | POA: Diagnosis not present

## 2023-11-02 DIAGNOSIS — I10 Essential (primary) hypertension: Secondary | ICD-10-CM | POA: Diagnosis not present

## 2023-11-02 DIAGNOSIS — E785 Hyperlipidemia, unspecified: Secondary | ICD-10-CM | POA: Diagnosis not present

## 2023-11-02 DIAGNOSIS — Z1211 Encounter for screening for malignant neoplasm of colon: Secondary | ICD-10-CM | POA: Diagnosis not present

## 2024-04-06 DIAGNOSIS — Z1211 Encounter for screening for malignant neoplasm of colon: Secondary | ICD-10-CM | POA: Diagnosis not present

## 2024-04-06 DIAGNOSIS — J4520 Mild intermittent asthma, uncomplicated: Secondary | ICD-10-CM | POA: Diagnosis not present

## 2024-04-06 DIAGNOSIS — D709 Neutropenia, unspecified: Secondary | ICD-10-CM | POA: Diagnosis not present

## 2024-04-06 DIAGNOSIS — I10 Essential (primary) hypertension: Secondary | ICD-10-CM | POA: Diagnosis not present

## 2024-04-06 DIAGNOSIS — Z0000 Encounter for general adult medical examination without abnormal findings: Secondary | ICD-10-CM | POA: Diagnosis not present

## 2024-04-06 DIAGNOSIS — E785 Hyperlipidemia, unspecified: Secondary | ICD-10-CM | POA: Diagnosis not present

## 2024-04-06 DIAGNOSIS — M8588 Other specified disorders of bone density and structure, other site: Secondary | ICD-10-CM | POA: Diagnosis not present

## 2024-04-06 DIAGNOSIS — D72820 Lymphocytosis (symptomatic): Secondary | ICD-10-CM | POA: Diagnosis not present

## 2024-04-06 DIAGNOSIS — D582 Other hemoglobinopathies: Secondary | ICD-10-CM | POA: Diagnosis not present

## 2024-04-06 DIAGNOSIS — K5900 Constipation, unspecified: Secondary | ICD-10-CM | POA: Diagnosis not present

## 2024-04-06 DIAGNOSIS — J309 Allergic rhinitis, unspecified: Secondary | ICD-10-CM | POA: Diagnosis not present

## 2024-05-11 DIAGNOSIS — D709 Neutropenia, unspecified: Secondary | ICD-10-CM | POA: Diagnosis not present

## 2024-05-11 DIAGNOSIS — Z1211 Encounter for screening for malignant neoplasm of colon: Secondary | ICD-10-CM | POA: Diagnosis not present

## 2024-05-13 DIAGNOSIS — I10 Essential (primary) hypertension: Secondary | ICD-10-CM | POA: Diagnosis not present

## 2024-05-13 DIAGNOSIS — E785 Hyperlipidemia, unspecified: Secondary | ICD-10-CM | POA: Diagnosis not present

## 2024-10-06 DIAGNOSIS — M25561 Pain in right knee: Secondary | ICD-10-CM | POA: Diagnosis not present

## 2024-10-06 DIAGNOSIS — E785 Hyperlipidemia, unspecified: Secondary | ICD-10-CM | POA: Diagnosis not present

## 2024-10-06 DIAGNOSIS — I10 Essential (primary) hypertension: Secondary | ICD-10-CM | POA: Diagnosis not present
